# Patient Record
Sex: Female | Born: 1960 | Race: White | Hispanic: No | Marital: Married
Health system: Southern US, Community
[De-identification: ages and names within clinical notes are randomized; demographics above are authoritative.]

---
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
## 1997-11-04 HISTORY — PX: BREAST EXCISIONAL BIOPSY: SUR124

## 1998-05-15 ENCOUNTER — Ambulatory Visit (HOSPITAL_COMMUNITY): Admission: RE | Admit: 1998-05-15 | Discharge: 1998-05-15 | Payer: Self-pay | Admitting: *Deleted

## 1998-11-13 ENCOUNTER — Ambulatory Visit (HOSPITAL_COMMUNITY): Admission: RE | Admit: 1998-11-13 | Discharge: 1998-11-13 | Payer: Self-pay | Admitting: *Deleted

## 1998-11-13 ENCOUNTER — Encounter: Payer: Self-pay | Admitting: *Deleted

## 1998-11-21 ENCOUNTER — Other Ambulatory Visit: Admission: RE | Admit: 1998-11-21 | Discharge: 1998-11-21 | Payer: Self-pay | Admitting: *Deleted

## 1999-12-04 ENCOUNTER — Ambulatory Visit (HOSPITAL_COMMUNITY): Admission: RE | Admit: 1999-12-04 | Discharge: 1999-12-04 | Payer: Self-pay | Admitting: *Deleted

## 1999-12-04 ENCOUNTER — Encounter: Payer: Self-pay | Admitting: *Deleted

## 2000-12-05 ENCOUNTER — Encounter: Payer: Self-pay | Admitting: *Deleted

## 2000-12-05 ENCOUNTER — Ambulatory Visit (HOSPITAL_COMMUNITY): Admission: RE | Admit: 2000-12-05 | Discharge: 2000-12-05 | Payer: Self-pay | Admitting: *Deleted

## 2000-12-08 ENCOUNTER — Encounter: Admission: RE | Admit: 2000-12-08 | Discharge: 2000-12-08 | Payer: Self-pay | Admitting: *Deleted

## 2000-12-08 ENCOUNTER — Encounter: Payer: Self-pay | Admitting: *Deleted

## 2001-01-02 ENCOUNTER — Other Ambulatory Visit: Admission: RE | Admit: 2001-01-02 | Discharge: 2001-01-02 | Payer: Self-pay | Admitting: *Deleted

## 2002-01-26 ENCOUNTER — Ambulatory Visit (HOSPITAL_COMMUNITY): Admission: RE | Admit: 2002-01-26 | Discharge: 2002-01-26 | Payer: Self-pay | Admitting: *Deleted

## 2002-01-26 ENCOUNTER — Encounter: Payer: Self-pay | Admitting: *Deleted

## 2002-01-27 ENCOUNTER — Other Ambulatory Visit: Admission: RE | Admit: 2002-01-27 | Discharge: 2002-01-27 | Payer: Self-pay | Admitting: *Deleted

## 2003-03-17 ENCOUNTER — Encounter: Payer: Self-pay | Admitting: *Deleted

## 2003-03-17 ENCOUNTER — Ambulatory Visit (HOSPITAL_COMMUNITY): Admission: RE | Admit: 2003-03-17 | Discharge: 2003-03-17 | Payer: Self-pay | Admitting: *Deleted

## 2003-03-31 ENCOUNTER — Other Ambulatory Visit: Admission: RE | Admit: 2003-03-31 | Discharge: 2003-03-31 | Payer: Self-pay | Admitting: *Deleted

## 2004-03-20 ENCOUNTER — Ambulatory Visit (HOSPITAL_COMMUNITY): Admission: RE | Admit: 2004-03-20 | Discharge: 2004-03-20 | Payer: Self-pay | Admitting: *Deleted

## 2004-07-25 ENCOUNTER — Other Ambulatory Visit: Admission: RE | Admit: 2004-07-25 | Discharge: 2004-07-25 | Payer: Self-pay | Admitting: *Deleted

## 2004-10-01 ENCOUNTER — Encounter (INDEPENDENT_AMBULATORY_CARE_PROVIDER_SITE_OTHER): Payer: Self-pay | Admitting: Specialist

## 2004-10-01 ENCOUNTER — Ambulatory Visit (HOSPITAL_COMMUNITY): Admission: RE | Admit: 2004-10-01 | Discharge: 2004-10-01 | Payer: Self-pay | Admitting: Obstetrics and Gynecology

## 2004-10-01 HISTORY — PX: HYSTEROSCOPY WITH RESECTOSCOPE: SHX5395

## 2005-07-02 ENCOUNTER — Ambulatory Visit (HOSPITAL_COMMUNITY): Admission: RE | Admit: 2005-07-02 | Discharge: 2005-07-02 | Payer: Self-pay | Admitting: *Deleted

## 2005-08-20 ENCOUNTER — Other Ambulatory Visit: Admission: RE | Admit: 2005-08-20 | Discharge: 2005-08-20 | Payer: Self-pay | Admitting: *Deleted

## 2006-07-03 ENCOUNTER — Ambulatory Visit (HOSPITAL_COMMUNITY): Admission: RE | Admit: 2006-07-03 | Discharge: 2006-07-03 | Payer: Self-pay | Admitting: Obstetrics & Gynecology

## 2006-08-22 ENCOUNTER — Other Ambulatory Visit: Admission: RE | Admit: 2006-08-22 | Discharge: 2006-08-22 | Payer: Self-pay | Admitting: Obstetrics and Gynecology

## 2007-07-06 ENCOUNTER — Ambulatory Visit (HOSPITAL_COMMUNITY): Admission: RE | Admit: 2007-07-06 | Discharge: 2007-07-06 | Payer: Self-pay | Admitting: Obstetrics and Gynecology

## 2008-07-01 ENCOUNTER — Other Ambulatory Visit: Admission: RE | Admit: 2008-07-01 | Discharge: 2008-07-01 | Payer: Self-pay | Admitting: Obstetrics and Gynecology

## 2008-07-06 ENCOUNTER — Ambulatory Visit (HOSPITAL_COMMUNITY): Admission: RE | Admit: 2008-07-06 | Discharge: 2008-07-06 | Payer: Self-pay | Admitting: Obstetrics and Gynecology

## 2008-07-11 ENCOUNTER — Encounter: Payer: Self-pay | Admitting: Obstetrics and Gynecology

## 2008-07-11 ENCOUNTER — Ambulatory Visit (HOSPITAL_BASED_OUTPATIENT_CLINIC_OR_DEPARTMENT_OTHER): Admission: RE | Admit: 2008-07-11 | Discharge: 2008-07-11 | Payer: Self-pay | Admitting: Obstetrics and Gynecology

## 2008-07-11 HISTORY — PX: HYSTEROSCOPY WITH RESECTOSCOPE: SHX5395

## 2009-07-07 ENCOUNTER — Ambulatory Visit (HOSPITAL_COMMUNITY): Admission: RE | Admit: 2009-07-07 | Discharge: 2009-07-07 | Payer: Self-pay | Admitting: Obstetrics and Gynecology

## 2010-07-10 ENCOUNTER — Ambulatory Visit (HOSPITAL_COMMUNITY): Admission: RE | Admit: 2010-07-10 | Discharge: 2010-07-10 | Payer: Self-pay | Admitting: Obstetrics and Gynecology

## 2011-05-07 NOTE — Op Note (Signed)
NAME:  Robin Shields, Robin Shields           ACCOUNT NO.:  [id number]   MEDICAL RECORD NO.:  [id number]          PATIENT TYPE:  AMB   LOCATION:  [facility]                         FACILITY:  [facility]   PHYSICIAN:  Cynthia P. Romine, M.D.DATE OF BIRTH:  1961/11/09   DATE OF PROCEDURE:  07/11/2008  DATE OF DISCHARGE:                               OPERATIVE REPORT   PREOPERATIVE DIAGNOSIS:  Menorrhagia with endometrial polyp.   POSTOPERATIVE DIAGNOSIS:  Menorrhagia with endometrial polyp, pathology  pending.   PROCEDURE:  Examination under anesthesia.  Hysteroscopic resection of  endometrial polyp, dilation and curettage.   SURGEON:  Dr. Aram Beecham Romine.   ANESTHESIA:  General by LMA.   ESTIMATED BLOOD LOSS:  Minimal.   SORBITOL DEFICIT:  30 mL.   COMPLICATIONS:  None.   PROCEDURE:  The patient was taken to the operating room and after  induction of adequate general anesthesia, was placed in dorsal lithotomy  position and prepped and draped in the usual fashion.  The bladder was  drained with a red rubber catheter.  A posterior weighted and anterior  Sims retractor were placed.  The cervix was grasped on its anterior lip  with a single-tooth tenaculum.  Paracervical block was given by  injecting 10 mL of 1% Xylocaine at each of 3 and 9 o'clock.  The uterus  was sounded to 11 cm.  The cervix was dilated to a #31 Pratt.  The  operative hysteroscope was then inserted under direct visualization.  Sorbitol was used as the distention medium.  The pressure pump was set  at 70 mmHg.  Hysteroscopy revealed the uterine cavity did have multiple  polyps primarily in the body of the posterior side of the endometrium  and there was shaggy endometrium throughout.  The single loop cautery  was used to resect the polyps after photographic documentation was  taken.  Following hysteroscopic resection, the scope was withdrawn and  sharp curettage was carried out gently.  A very large amount of tissue  was obtained  with the curettage.  It was sent to pathology with the  polyp.  The hysteroscope was then reintroduced.  The cavity was felt to  be clean and the procedure was terminated.  The scope was withdrawn.  The instruments were removed from the vagina and the patient was taken  to recovery room in satisfactory condition.      Cynthia P. Romine, M.D.  Electronically Signed     CPR/MEDQ  D:  07/11/2008  T:  07/11/2008  Job:  [id number]

## 2011-05-10 NOTE — Op Note (Signed)
NAME:  Robin Shields, Robin Shields           ACCOUNT NO.:  [id number]   MEDICAL RECORD NO.:  [id number]          PATIENT TYPE:  AMB   LOCATION:  [facility]                           FACILITY:  [facility]   PHYSICIAN:  Cynthia P. Romine, M.D.DATE OF BIRTH:  1961/02/01   DATE OF PROCEDURE:  10/01/2004  DATE OF DISCHARGE:                                 OPERATIVE REPORT   PREOPERATIVE DIAGNOSES:  Menorrhagia, known endometrial polyps on  sonohysterogram.   POSTOPERATIVE DIAGNOSES:  Menorrhagia, known endometrial polyps on  sonohysterogram. Path pending.   PROCEDURE:  Hysteroscopic resection of endometrial polyps, D&C.   SURGEON:  Cynthia P. Romine, M.D.   ANESTHESIA:  General by mask.   ESTIMATED BLOOD LOSS:  Minimal.   SORBITOL DEFICIT:  70 mL.   COMPLICATIONS:  None.   DESCRIPTION OF PROCEDURE:  The patient was taken to the operating room and  after the induction of IV sedation by anesthesia, she was prepped and draped  in the usual fashion and bladder drained with a red rubber catheter.  A  posterior weighted and anterior Simms retractor were placed, the cervix was  grasped off the anterior lip with a single tooth tenaculum.  The uterus  sounded to 9 cm. During the process of sounding the cervix to a #31 Shawnie Pons,  the patient became uncomfortable and anesthesia elected to give her general  anesthesia by mask.  The operative hysteroscope was introduced, several  polyps were noted along the posterior wall of the uterus as known by  sonohysterogram.  Resection was carried out with a single loop. The  hysteroscope was withdrawn, sharp curettage was carried out with a large  amount of tissue being obtained. The hysteroscope was reintroduced, several  more areas of shaggy endometrium were resected.  __________ graphic  documentation was taken as a clear cavity after resection. Instruments  removed from the vagina and the procedure was terminated. The #10 tenaculum  was removed, there was some bleeding at  the tenaculum site that was  controlled with silver nitrate.      CPR/MEDQ  D:  10/01/2004  T:  10/01/2004  Job:  [id number]

## 2011-07-04 ENCOUNTER — Other Ambulatory Visit: Payer: Self-pay | Admitting: Obstetrics and Gynecology

## 2011-07-04 DIAGNOSIS — Z1231 Encounter for screening mammogram for malignant neoplasm of breast: Secondary | ICD-10-CM

## 2011-07-12 ENCOUNTER — Ambulatory Visit (HOSPITAL_COMMUNITY)
Admission: RE | Admit: 2011-07-12 | Discharge: 2011-07-12 | Disposition: A | Discharge status: Home / Self Care | Payer: 59 | Source: Ambulatory Visit | Attending: Obstetrics and Gynecology | Admitting: Obstetrics and Gynecology

## 2011-07-12 DIAGNOSIS — Z1231 Encounter for screening mammogram for malignant neoplasm of breast: Secondary | ICD-10-CM | POA: Insufficient documentation

## 2011-09-20 LAB — CBC
HCT: 42.3
Hemoglobin: 14
MCHC: 33.1
MCV: 92.8
Platelets: 306
RBC: 4.56
RDW: 13.4
WBC: 4.4

## 2011-09-20 LAB — HCG, SERUM, QUALITATIVE: Preg, Serum: NEGATIVE

## 2012-06-18 ENCOUNTER — Other Ambulatory Visit: Payer: Self-pay | Admitting: Obstetrics and Gynecology

## 2012-06-18 DIAGNOSIS — Z1231 Encounter for screening mammogram for malignant neoplasm of breast: Secondary | ICD-10-CM

## 2012-07-29 ENCOUNTER — Ambulatory Visit (HOSPITAL_COMMUNITY)
Admission: RE | Admit: 2012-07-29 | Discharge: 2012-07-29 | Disposition: A | Discharge status: Home / Self Care | Payer: BC Managed Care – PPO | Source: Ambulatory Visit | Attending: Obstetrics and Gynecology | Admitting: Obstetrics and Gynecology

## 2012-07-29 DIAGNOSIS — Z1231 Encounter for screening mammogram for malignant neoplasm of breast: Secondary | ICD-10-CM | POA: Insufficient documentation

## 2013-06-09 ENCOUNTER — Telehealth: Payer: Self-pay | Admitting: Obstetrics and Gynecology

## 2013-06-09 NOTE — Telephone Encounter (Signed)
Patient having menopausal symptoms she thinks and wants to speak with nurse.

## 2013-06-09 NOTE — Telephone Encounter (Signed)
LMTCB 06/09/13 cm

## 2013-06-09 NOTE — Telephone Encounter (Signed)
Patient called back to speak with someone about the symptoms she has been having due to menopause.Dizzy,nausea,hot flashes.

## 2013-06-15 NOTE — Telephone Encounter (Signed)
Spoke with patient she was complaining of hot flashes,  nausea, dizzy spells last week at work, The nausea and dizzy spells have subsided still having an  occasional hot flash much better than last week. Pt will call back to schedule an appointment with Dr. Tresa Res before August if she needs to come in to discuss Treatment.

## 2013-06-15 NOTE — Telephone Encounter (Signed)
LMTCB 06/15/13 cm

## 2013-07-30 ENCOUNTER — Other Ambulatory Visit: Payer: Self-pay | Admitting: Obstetrics and Gynecology

## 2013-07-30 DIAGNOSIS — Z1231 Encounter for screening mammogram for malignant neoplasm of breast: Secondary | ICD-10-CM

## 2013-08-02 ENCOUNTER — Ambulatory Visit (HOSPITAL_COMMUNITY)
Admission: RE | Admit: 2013-08-02 | Discharge: 2013-08-02 | Disposition: A | Discharge status: Home / Self Care | Payer: BC Managed Care – PPO | Source: Ambulatory Visit | Attending: Obstetrics and Gynecology | Admitting: Obstetrics and Gynecology

## 2013-08-02 DIAGNOSIS — Z1231 Encounter for screening mammogram for malignant neoplasm of breast: Secondary | ICD-10-CM | POA: Insufficient documentation

## 2013-10-21 ENCOUNTER — Encounter: Payer: Self-pay | Admitting: Obstetrics and Gynecology

## 2013-10-25 ENCOUNTER — Ambulatory Visit: Payer: Self-pay | Admitting: Obstetrics and Gynecology

## 2013-10-25 ENCOUNTER — Ambulatory Visit (INDEPENDENT_AMBULATORY_CARE_PROVIDER_SITE_OTHER): Payer: BC Managed Care – PPO | Admitting: Gynecology

## 2013-10-25 ENCOUNTER — Encounter: Payer: Self-pay | Admitting: Gynecology

## 2013-10-25 VITALS — BP 104/60 | HR 72 | Resp 14 | Ht 65.75 in | Wt 116.0 lb

## 2013-10-25 DIAGNOSIS — Z01419 Encounter for gynecological examination (general) (routine) without abnormal findings: Secondary | ICD-10-CM

## 2013-10-25 DIAGNOSIS — Z0000 Encounter for general adult medical examination without abnormal findings: Secondary | ICD-10-CM

## 2013-10-25 DIAGNOSIS — Z124 Encounter for screening for malignant neoplasm of cervix: Secondary | ICD-10-CM

## 2013-10-25 LAB — POCT URINALYSIS DIPSTICK
Leukocytes, UA: NEGATIVE
Urobilinogen, UA: NEGATIVE
pH, UA: 5

## 2013-10-25 LAB — HEMOGLOBIN, FINGERSTICK: Hemoglobin, fingerstick: 14.2 g/dL (ref 12.0–16.0)

## 2013-10-25 NOTE — Progress Notes (Signed)
52 y.o. Married Caucasian female   [phone] here for annual exam. Pt reports menses are absent. She does report hot flashes, does have night sweats, does have vaginal dryness.  She is using lubricants.  Pt reports last cycle 08/09/2013, prior was 4/14 and 3/14.  Last cycle lasted 2w, light usually heavy.  2 cycles in March.  June reports weight gain that went away after August cycle.    Patient's last menstrual period was 08/09/2013.          Sexually active: yes  The current method of family planning:  none.    Exercising: yes  swim, run 3x/wk Last pap: 10/04/11 Negative Abnormal PAP: not sure Mammogram: 08/03/13- Bi Rads 1 BSE: yes Colonoscopy: 12/14/09 DEXA: none Alcohol: no Tobacco: no  Hgb: 14.2  ; Urine: Leuks 2  Health Maintenance  Topic Date Due  . Pap Smear  06/11/1977  . Colonoscopy  06/11/2009  . Influenza Vaccine  07/23/2013  . Mammogram  08/03/2015  . Tetanus/tdap  09/19/2020    Family History  Problem Relation Age of Onset  . Breast cancer Sister 68    BRCA negative  . Hypertension Mother   . Breast cancer Maternal Grandmother     There are no active problems to display for this patient.   History reviewed. No pertinent past medical history.  Past Surgical History  Procedure Laterality Date  . Tonsillectomy and adenoidectomy  1965  . Hysteroscopy with resectoscope Left 10/01/2004    endo polyps- (CR)  . Hysteroscopy with resectoscope  07/11/2008    Polyp, D&C- (CR)    Allergies: Codeine and Morphine and related  Current Outpatient Prescriptions  Medication Sig Dispense Refill  . Ibuprofen (ADVIL PO) Take by mouth as needed.       Marland Kitchen MAGNESIUM PO Take by mouth.      . Multiple Vitamins-Minerals (MULTIVITAMIN PO) Take by mouth.      . Flaxseed, Linseed, (FLAX SEED OIL PO) Take by mouth.      Marland Kitchen FLUVIRIN PRESERVATIVE FREE SUSP injection        No current facility-administered medications for this visit.    ROS: Pertinent items are noted in  HPI.  Exam:    BP 104/60  Pulse 72  Resp 14  Ht 5' 5.75" (1.67 m)  Wt 116 lb (52.617 kg)  BMI 18.87 kg/m2  LMP 08/09/2013 Weight change: @WEIGHTCHANGE @ Last 3 height recordings:  Ht Readings from Last 3 Encounters:  10/25/13 5' 5.75" (1.67 m)   General appearance: alert, cooperative and appears stated age Head: Normocephalic, without obvious abnormality, atraumatic Neck: no adenopathy, no carotid bruit, no JVD, supple, symmetrical, trachea midline and thyroid not enlarged, symmetric, no tenderness/mass/nodules Lungs: clear to auscultation bilaterally Breasts: normal appearance, no masses or tenderness Heart: regular rate and rhythm, S1, S2 normal, no murmur, click, rub or gallop Abdomen: soft, non-tender; bowel sounds normal; no masses,  no organomegaly Extremities: extremities normal, atraumatic, no cyanosis or edema Skin: Skin color, texture, turgor normal. No rashes or lesions Lymph nodes: Cervical, supraclavicular, and axillary nodes normal. no inguinal nodes palpated Neurologic: Grossly normal   Pelvic: External genitalia:  no lesions              Urethra: normal appearing urethra with no masses, tenderness or lesions              Bartholins and Skenes: normal                 Vagina: normal appearing vagina  with normal color and discharge, no lesions              Cervix: normal appearance              Pap taken: yes        Bimanual Exam:  Uterus:  uterus is normal size, shape, consistency and nontender                                      Adnexa:    normal adnexa in size, nontender and no masses                                      Rectovaginal: Confirms                                      Anus:  normal sphincter tone, no lesions  A: well woman Perimenopausal      P: mammogram annual pap smear with HRHPV counseled on breast self exam, mammography screening, menopause, adequate intake of calcium and vitamin D, diet and exercise return annually or prn Discussed PAP  guideline changes, importance of weight bearing exercises, calcium, vit D and balanced diet.  An After Visit Summary was printed and given to the patient.

## 2013-10-25 NOTE — Patient Instructions (Signed)

## 2013-10-27 LAB — IPS PAP TEST WITH HPV

## 2014-06-16 ENCOUNTER — Encounter: Payer: Self-pay | Admitting: Gynecology

## 2014-07-25 ENCOUNTER — Other Ambulatory Visit: Payer: Self-pay | Admitting: Gynecology

## 2014-07-25 DIAGNOSIS — Z1231 Encounter for screening mammogram for malignant neoplasm of breast: Secondary | ICD-10-CM

## 2014-08-04 ENCOUNTER — Ambulatory Visit (HOSPITAL_COMMUNITY)
Admission: RE | Admit: 2014-08-04 | Discharge: 2014-08-04 | Disposition: A | Discharge status: Home / Self Care | Payer: BC Managed Care – PPO | Source: Ambulatory Visit | Attending: Gynecology | Admitting: Gynecology

## 2014-08-04 DIAGNOSIS — Z1231 Encounter for screening mammogram for malignant neoplasm of breast: Secondary | ICD-10-CM | POA: Insufficient documentation

## 2014-10-19 ENCOUNTER — Telehealth: Payer: Self-pay | Admitting: Gynecology

## 2014-10-19 NOTE — Telephone Encounter (Signed)
Left message regarding cancelled appointment. °

## 2014-10-24 ENCOUNTER — Encounter: Payer: Self-pay | Admitting: Gynecology

## 2014-10-31 ENCOUNTER — Ambulatory Visit: Payer: BC Managed Care – PPO | Admitting: Gynecology

## 2014-12-26 ENCOUNTER — Other Ambulatory Visit: Payer: Self-pay | Admitting: Obstetrics and Gynecology

## 2014-12-27 LAB — CYTOLOGY - PAP

## 2015-10-10 ENCOUNTER — Other Ambulatory Visit: Payer: Self-pay

## 2015-10-10 DIAGNOSIS — Z1231 Encounter for screening mammogram for malignant neoplasm of breast: Secondary | ICD-10-CM

## 2015-10-12 ENCOUNTER — Ambulatory Visit
Admission: RE | Admit: 2015-10-12 | Discharge: 2015-10-12 | Disposition: A | Discharge status: Home / Self Care | Payer: BLUE CROSS/BLUE SHIELD | Source: Ambulatory Visit

## 2015-10-12 DIAGNOSIS — Z1231 Encounter for screening mammogram for malignant neoplasm of breast: Secondary | ICD-10-CM

## 2016-12-07 ENCOUNTER — Emergency Department (HOSPITAL_COMMUNITY): Payer: BLUE CROSS/BLUE SHIELD

## 2016-12-07 ENCOUNTER — Encounter (HOSPITAL_COMMUNITY): Payer: Self-pay | Admitting: Emergency Medicine

## 2016-12-07 ENCOUNTER — Emergency Department (HOSPITAL_COMMUNITY)
Admission: EM | Admit: 2016-12-07 | Discharge: 2016-12-07 | Disposition: A | Discharge status: Home / Self Care | Payer: BLUE CROSS/BLUE SHIELD | Attending: Emergency Medicine | Admitting: Emergency Medicine

## 2016-12-07 DIAGNOSIS — Y939 Activity, unspecified: Secondary | ICD-10-CM | POA: Diagnosis not present

## 2016-12-07 DIAGNOSIS — S0003XA Contusion of scalp, initial encounter: Secondary | ICD-10-CM | POA: Insufficient documentation

## 2016-12-07 DIAGNOSIS — Y929 Unspecified place or not applicable: Secondary | ICD-10-CM | POA: Insufficient documentation

## 2016-12-07 DIAGNOSIS — S00431A Contusion of right ear, initial encounter: Secondary | ICD-10-CM | POA: Insufficient documentation

## 2016-12-07 DIAGNOSIS — Y999 Unspecified external cause status: Secondary | ICD-10-CM | POA: Insufficient documentation

## 2016-12-07 DIAGNOSIS — S0083XA Contusion of other part of head, initial encounter: Secondary | ICD-10-CM

## 2016-12-07 DIAGNOSIS — S0990XA Unspecified injury of head, initial encounter: Secondary | ICD-10-CM | POA: Diagnosis present

## 2016-12-07 DIAGNOSIS — W108XXA Fall (on) (from) other stairs and steps, initial encounter: Secondary | ICD-10-CM | POA: Diagnosis not present

## 2016-12-07 MED ORDER — IBUPROFEN 800 MG PO TABS
800.0000 mg | ORAL_TABLET | Freq: Once | ORAL | Status: AC
  Administered 2016-12-07: 800 mg via ORAL
  Filled 2016-12-07: qty 1

## 2016-12-07 NOTE — ED Notes (Signed)
Pt pulling at c-collar, instructed pt the importance of not to mess with c-collar inside c-spine is cleared.

## 2016-12-07 NOTE — ED Notes (Signed)
Pressure dressing applied to hematoma on R ear

## 2016-12-07 NOTE — ED Notes (Signed)
EDP at bedside  

## 2016-12-07 NOTE — ED Provider Notes (Addendum)
[facility] DEPT Provider Note   CSN: [id number] Arrival date & time: 12/07/16  0111   By signing my name below, I, Evelene Croon, attest that this documentation has been prepared under the direction and in the presence of Orpah Greek, MD . Electronically Signed: Evelene Croon, Scribe. 12/07/2016. 4:39 AM.   History   Chief Complaint Chief Complaint  Patient presents with  . Fall    The history is provided by the patient. No language interpreter was used.   HPI Comments:  Robin Shields is a 55 y.o. female who presents to the Emergency Department via EMS s/p fall just PTA. Pt arrives in c-collar. Pt fell down 8-10 steps, states she changed her mind at the top and wanted to come back down and lost her balance. She states she struck her face on the steps and the back of her head on the hardwood ground; no LOC. At this time is complaining of a HA and a knot to back of head following the incident. She reports associated neck soreness, nasal pain, and right lower back pain. She notes her right ear feels like it has water in it; reports decreased hearing from the ear. Pt's family reports blood coming from the ear. No alleviating factors noted.   History reviewed. No pertinent past medical history.  There are no active problems to display for this patient.   Past Surgical History:  Procedure Laterality Date  . HYSTEROSCOPY WITH RESECTOSCOPE Left 10/01/2004   endo polyps- (CR)  . HYSTEROSCOPY WITH RESECTOSCOPE  07/11/2008   Polyp, D&C- (CR)  . TONSILLECTOMY AND ADENOIDECTOMY  1965    OB History    Gravida Para Term Preterm AB Living   _0 SAB TAB Ectopic Multiple Live Births           3       Home Medications    Prior to Admission medications   Medication Sig Start Date End Date Taking? Authorizing Provider  Cholecalciferol (VITAMIN D PO) Take 1 tablet by mouth daily.   Yes Historical Provider, MD  Flaxseed, Linseed, (FLAX SEED OIL PO) Take 1  tablet by mouth daily.    Yes Historical Provider, MD  KRILL OIL PO Take 1 tablet by mouth daily.   Yes Historical Provider, MD  MAGNESIUM PO Take 1 tablet by mouth daily.    Yes Historical Provider, MD  Multiple Vitamin (MULTIVITAMIN WITH MINERALS) TABS tablet Take 1 tablet by mouth daily.   Yes Historical Provider, MD  Probiotic Product (PROBIOTIC PO) Take 1 tablet by mouth daily.   Yes Historical Provider, MD    Family History Family History  Problem Relation Age of Onset  . Breast cancer Sister 36    BRCA negative  . Hypertension Mother   . Breast cancer Maternal Grandmother     Social History Social History  Substance Use Topics  . Smoking status: Not on file  . Smokeless tobacco: Not on file  . Alcohol use Not on file     Allergies   Codeine and Morphine and related   Review of Systems Review of Systems  HENT: Positive for ear discharge and ear pain.   Respiratory: Negative for shortness of breath.   Musculoskeletal: Positive for back pain and neck pain.  Neurological: Positive for headaches. Negative for syncope.  All other systems reviewed and are negative.    Physical Exam Updated Vital Signs BP 105/73   Pulse (!) 56  Temp 97.7 F (36.5 C) (Oral)   Resp 13   Ht _0  (1.575 m)   Wt 122 lb (55.3 kg)   SpO2 96%   BMI 22.31 kg/m   Physical Exam  Constitutional: She is oriented to person, place, and time. She appears well-developed and well-nourished. No distress.  HENT:  Head: Normocephalic and atraumatic.  Right Ear: Hearing normal.  Left Ear: Hearing normal.  Nose: Nose normal.  Mouth/Throat: Oropharynx is clear and moist and mucous membranes are normal.  Dried blood external right ear and blood in the canal obscuring TM  Slight swelling of the auricle without hematoma formation  Linear abrasion lateral aspect of auricle, no active bleeding  Eyes: Conjunctivae and EOM are normal. Pupils are equal, round, and reactive to light.  Neck: Neck  supple.  Paraspinal neck tenderness  Cardiovascular: Regular rhythm, S1 normal and S2 normal.  Exam reveals no gallop and no friction rub.   No murmur heard. Pulmonary/Chest: Effort normal and breath sounds normal. No respiratory distress. She exhibits no tenderness.  Abdominal: Soft. Normal appearance and bowel sounds are normal. There is no hepatosplenomegaly. There is no tenderness. There is no rebound, no guarding, no tenderness at McBurney's point and negative Murphy's sign. No hernia.  Musculoskeletal: Normal range of motion. She exhibits tenderness.  Neurological: She is alert and oriented to person, place, and time. She has normal strength. No cranial nerve deficit or sensory deficit. Coordination normal. GCS eye subscore is 4. GCS verbal subscore is 5. GCS motor subscore is 6.  Skin: Skin is warm, dry and intact. No rash noted. No cyanosis.  Right parietal contusion  Psychiatric: She has a normal mood and affect. Her speech is normal and behavior is normal. Thought content normal.  Nursing note and vitals reviewed.    ED Treatments / Results  DIAGNOSTIC STUDIES:  Oxygen Saturation is 96% on RA, normal by my interpretation.    COORDINATION OF CARE:  1:36 AM Discussed treatment plan with pt at bedside and pt agreed to plan.  Labs (all labs ordered are listed, but only abnormal results are displayed) Labs Reviewed - No data to display  EKG  EKG Interpretation None       Radiology Ct Head Wo Contrast  Result Date: 12/07/2016 CLINICAL DATA:  55 year old female with fall and bleeding from the right ear. EXAM: CT HEAD WITHOUT CONTRAST CT MAXILLOFACIAL WITHOUT CONTRAST CT CERVICAL SPINE WITHOUT CONTRAST TECHNIQUE: Multidetector CT imaging of the head, cervical spine, and maxillofacial structures were performed using the standard protocol without intravenous contrast. Multiplanar CT image reconstructions of the cervical spine and maxillofacial structures were also generated.  COMPARISON:  None. FINDINGS: CT HEAD FINDINGS Brain: No evidence of acute infarction, hemorrhage, hydrocephalus, extra-axial collection or mass lesion/mass effect. Vascular: No hyperdense vessel or unexpected calcification. Skull: Normal. Negative for fracture or focal lesion. Other: Right parietal scalp hematoma. CT MAXILLOFACIAL FINDINGS Osseous: No fracture or mandibular dislocation. No destructive process. Orbits: Negative. No traumatic or inflammatory finding. Sinuses: Right maxillary sinus retention cyst or polyp. The visualized paranasal sinuses and mastoid air cells are otherwise clear. Soft tissues: Negative. CT CERVICAL SPINE FINDINGS Alignment: Normal. Skull base and vertebrae: No acute fracture. No primary bone lesion or focal pathologic process. Soft tissues and spinal canal: No prevertebral fluid or swelling. No visible canal hematoma. Disc levels:  No acute findings. Upper chest: Negative. Other: None IMPRESSION: No acute intracranial pathology. No acute/ traumatic cervical spine pathology. No facial bone fractures. Electronically Signed  By: Anner Crete M.D.   On: 12/07/2016 03:19   Ct Cervical Spine Wo Contrast  Result Date: 12/07/2016 CLINICAL DATA:  54 year old female with fall and bleeding from the right ear. EXAM: CT HEAD WITHOUT CONTRAST CT MAXILLOFACIAL WITHOUT CONTRAST CT CERVICAL SPINE WITHOUT CONTRAST TECHNIQUE: Multidetector CT imaging of the head, cervical spine, and maxillofacial structures were performed using the standard protocol without intravenous contrast. Multiplanar CT image reconstructions of the cervical spine and maxillofacial structures were also generated. COMPARISON:  None. FINDINGS: CT HEAD FINDINGS Brain: No evidence of acute infarction, hemorrhage, hydrocephalus, extra-axial collection or mass lesion/mass effect. Vascular: No hyperdense vessel or unexpected calcification. Skull: Normal. Negative for fracture or focal lesion. Other: Right parietal scalp  hematoma. CT MAXILLOFACIAL FINDINGS Osseous: No fracture or mandibular dislocation. No destructive process. Orbits: Negative. No traumatic or inflammatory finding. Sinuses: Right maxillary sinus retention cyst or polyp. The visualized paranasal sinuses and mastoid air cells are otherwise clear. Soft tissues: Negative. CT CERVICAL SPINE FINDINGS Alignment: Normal. Skull base and vertebrae: No acute fracture. No primary bone lesion or focal pathologic process. Soft tissues and spinal canal: No prevertebral fluid or swelling. No visible canal hematoma. Disc levels:  No acute findings. Upper chest: Negative. Other: None IMPRESSION: No acute intracranial pathology. No acute/ traumatic cervical spine pathology. No facial bone fractures. Electronically Signed   By: Anner Crete M.D.   On: 12/07/2016 03:19   Ct Maxillofacial Wo Contrast  Result Date: 12/07/2016 CLINICAL DATA:  55 year old female with fall and bleeding from the right ear. EXAM: CT HEAD WITHOUT CONTRAST CT MAXILLOFACIAL WITHOUT CONTRAST CT CERVICAL SPINE WITHOUT CONTRAST TECHNIQUE: Multidetector CT imaging of the head, cervical spine, and maxillofacial structures were performed using the standard protocol without intravenous contrast. Multiplanar CT image reconstructions of the cervical spine and maxillofacial structures were also generated. COMPARISON:  None. FINDINGS: CT HEAD FINDINGS Brain: No evidence of acute infarction, hemorrhage, hydrocephalus, extra-axial collection or mass lesion/mass effect. Vascular: No hyperdense vessel or unexpected calcification. Skull: Normal. Negative for fracture or focal lesion. Other: Right parietal scalp hematoma. CT MAXILLOFACIAL FINDINGS Osseous: No fracture or mandibular dislocation. No destructive process. Orbits: Negative. No traumatic or inflammatory finding. Sinuses: Right maxillary sinus retention cyst or polyp. The visualized paranasal sinuses and mastoid air cells are otherwise clear. Soft tissues:  Negative. CT CERVICAL SPINE FINDINGS Alignment: Normal. Skull base and vertebrae: No acute fracture. No primary bone lesion or focal pathologic process. Soft tissues and spinal canal: No prevertebral fluid or swelling. No visible canal hematoma. Disc levels:  No acute findings. Upper chest: Negative. Other: None IMPRESSION: No acute intracranial pathology. No acute/ traumatic cervical spine pathology. No facial bone fractures. Electronically Signed   By: Anner Crete M.D.   On: 12/07/2016 03:19    Procedures Procedures (including critical care time)  Medications Ordered in ED Medications  ibuprofen (ADVIL,MOTRIN) tablet 800 mg (800 mg Oral Given 12/07/16 0148)     Initial Impression / Assessment and Plan / ED Course  I have reviewed the triage vital signs and the nursing notes.  Pertinent labs & imaging results that were available during my care of the patient were reviewed by me and considered in my medical decision making (see chart for details).  Clinical Course    She presents to the emergency department for evaluation after a fall. Patient complaining of right scalp, right ear and neck pain since the fall. There was no loss of consciousness. Patient is alert and oriented. CT head, cervical spine, facial bones  performed. No fractures or other acute abnormality noted. Patient does have blood on the auricle of her right ear as well as in the canal. I was unable to visualize tympanic membrane. Cannot rule out rupture. No sign of basilar skull fracture (no clear fluid, bruising, normal CT). Will refer to ENT for follow-up and was given ruptured tympanic membrane precautions. Patient does have swelling of the right ear without any hematoma formation that can be drained. Pressure dressing applied.  Final Clinical Impressions(s) / ED Diagnoses   Final diagnoses:  Contusion of face, initial encounter  Contusion of auricle of right ear, initial encounter    New Prescriptions New  Prescriptions   No medications on file   I personally performed the services described in this documentation, which was scribed in my presence. The recorded information has been reviewed and is accurate.     Orpah Greek, MD 12/07/16 [place], MD 12/07/16 [phone]

## 2016-12-07 NOTE — ED Triage Notes (Signed)
Pt in EMS after fall down stairs, tripped and fell. Fell approx 8-10 steps. Reports neck pain, HA, buttocks pain. Has blood coming from R ear. Pt A/OX4, but slow to respond. VSS.

## 2017-02-27 ENCOUNTER — Other Ambulatory Visit: Payer: Self-pay | Admitting: Obstetrics and Gynecology

## 2017-02-27 DIAGNOSIS — Z1231 Encounter for screening mammogram for malignant neoplasm of breast: Secondary | ICD-10-CM

## 2017-03-18 ENCOUNTER — Ambulatory Visit
Admission: RE | Admit: 2017-03-18 | Discharge: 2017-03-18 | Disposition: A | Discharge status: Home / Self Care | Payer: BLUE CROSS/BLUE SHIELD | Source: Ambulatory Visit | Attending: Obstetrics and Gynecology | Admitting: Obstetrics and Gynecology

## 2017-03-18 ENCOUNTER — Encounter: Payer: Self-pay | Admitting: Radiology

## 2017-03-18 DIAGNOSIS — Z1231 Encounter for screening mammogram for malignant neoplasm of breast: Secondary | ICD-10-CM

## 2017-08-20 IMAGING — MG DIGITAL SCREENING BILATERAL MAMMOGRAM WITH CAD
5 series · 5 of 5 positions shown · non-contrast
Comparison: Previous exam(s).

CLINICAL DATA: Screening.

EXAM:
DIGITAL SCREENING BILATERAL MAMMOGRAM WITH CAD

[R MLO]
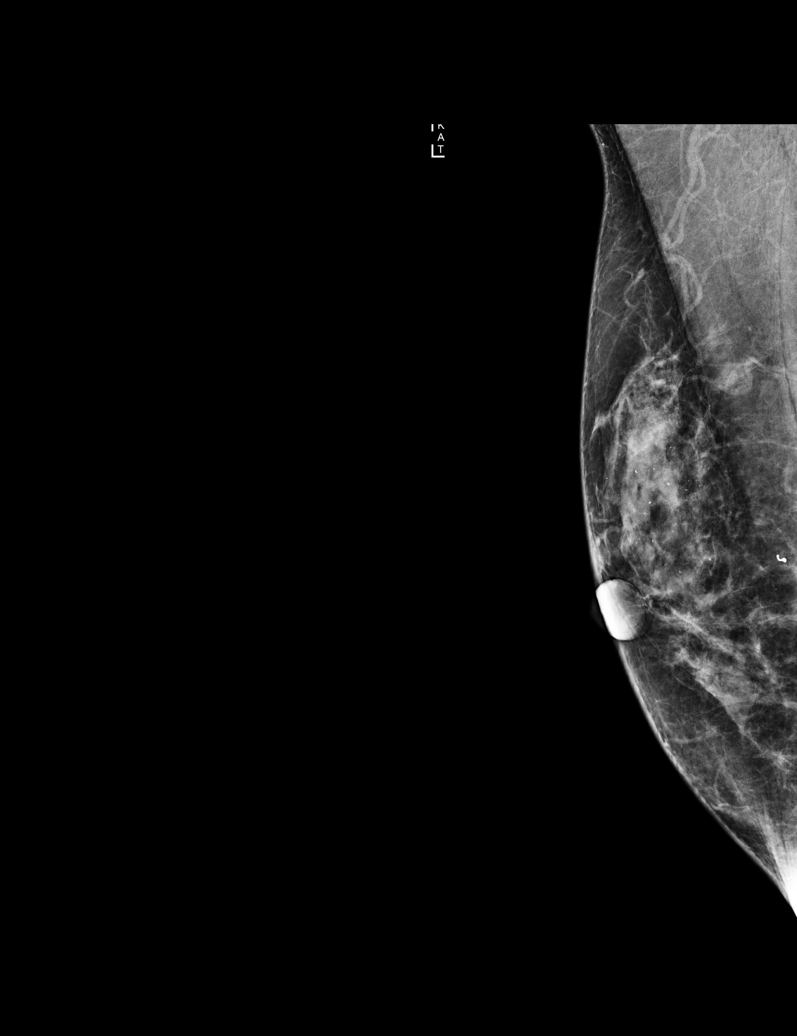

[L MLO (1 of 2)]
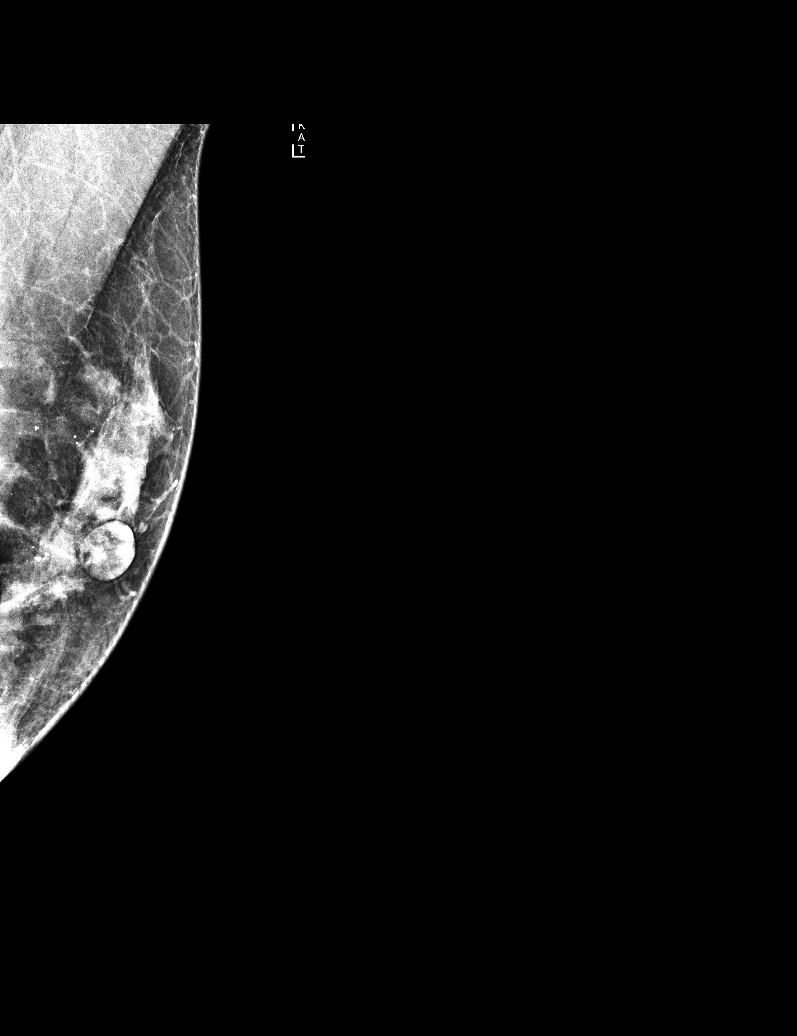

[R CC]
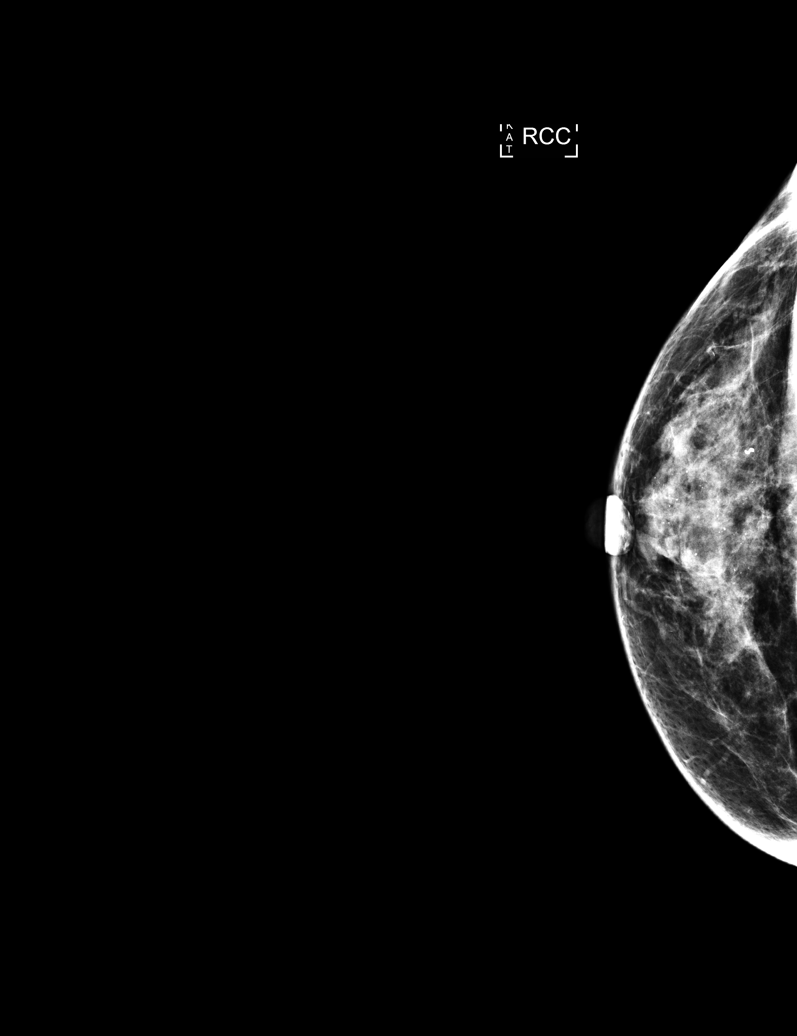

[L MLO (2 of 2)]
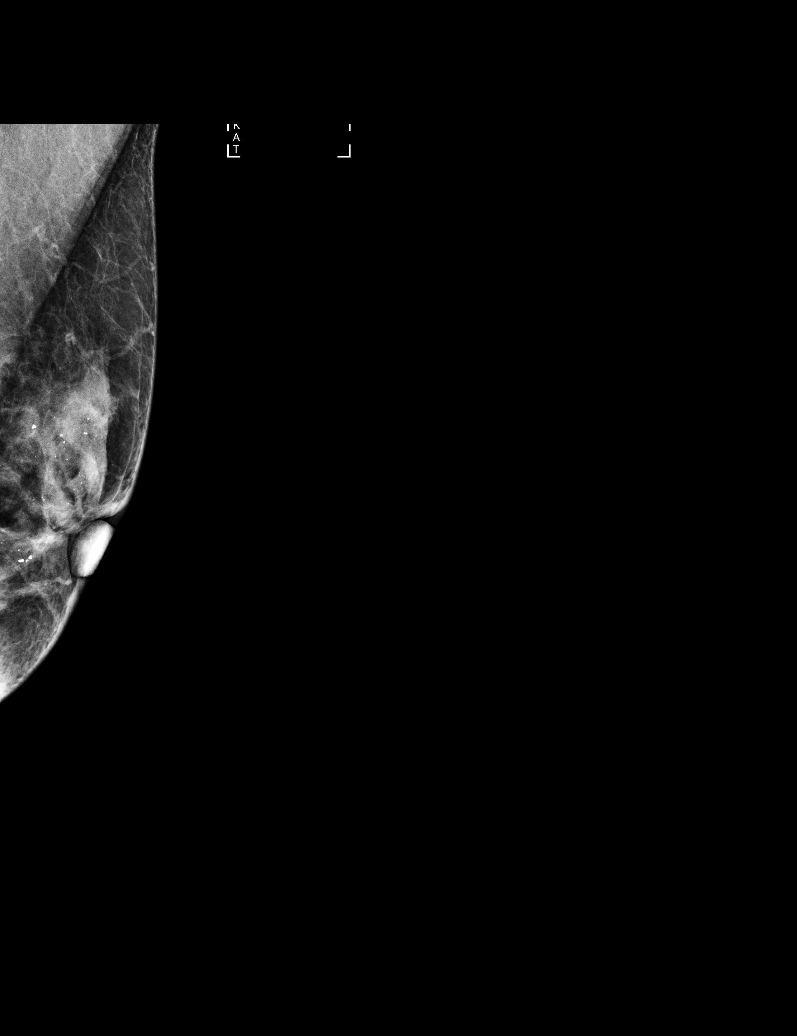

[L CC]
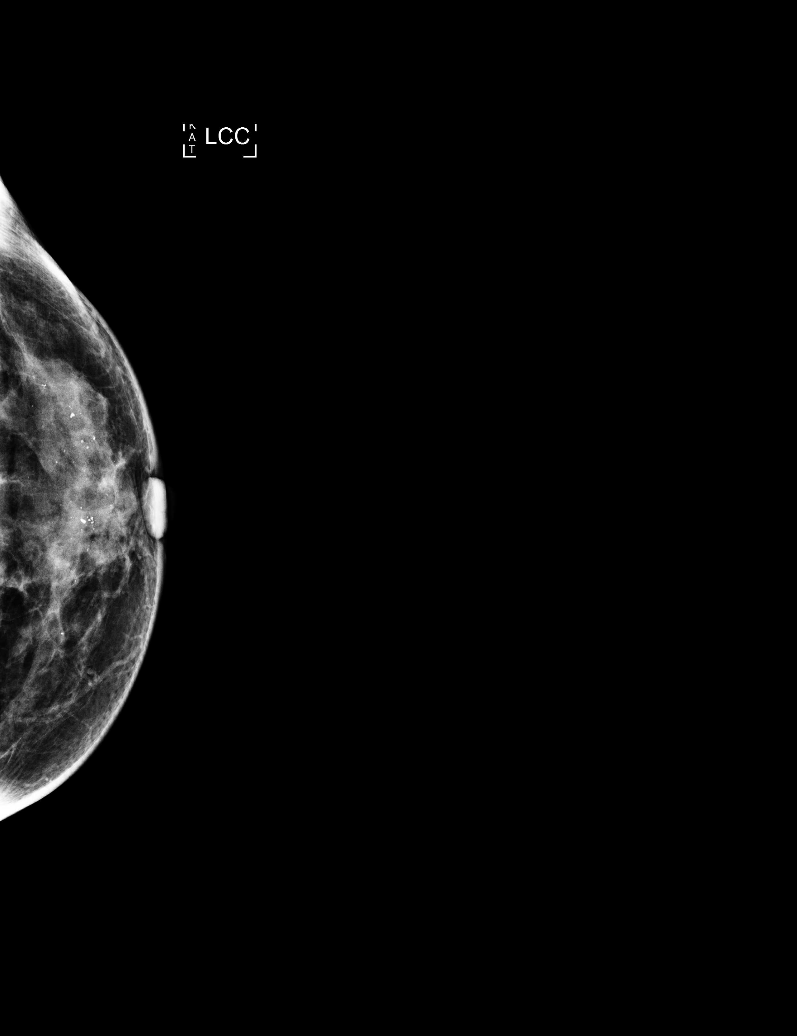

[5 of 5 positions shown; findings below may reference images not displayed]

ACR Breast Density Category c: The breast tissue is heterogeneously
dense, which may obscure small masses.
FINDINGS: There are no findings suspicious for malignancy. Images were
processed with CAD.
IMPRESSION: No mammographic evidence of malignancy. A result letter of this
screening mammogram will be mailed directly to the patient.

RECOMMENDATION:
Screening mammogram in one year. (Code:YJ-2-FEZ)

BI-RADS CATEGORY  1: Negative.

## 2018-09-28 ENCOUNTER — Other Ambulatory Visit: Payer: Self-pay | Admitting: Obstetrics & Gynecology

## 2018-09-28 DIAGNOSIS — Z1231 Encounter for screening mammogram for malignant neoplasm of breast: Secondary | ICD-10-CM

## 2018-10-30 ENCOUNTER — Ambulatory Visit: Payer: BLUE CROSS/BLUE SHIELD

## 2024-01-05 ENCOUNTER — Other Ambulatory Visit (INDEPENDENT_AMBULATORY_CARE_PROVIDER_SITE_OTHER): Payer: Self-pay

## 2024-01-05 ENCOUNTER — Encounter: Payer: Self-pay | Admitting: Orthopedic Surgery

## 2024-01-05 ENCOUNTER — Ambulatory Visit: Payer: 59 | Admitting: Orthopedic Surgery

## 2024-01-05 DIAGNOSIS — M25561 Pain in right knee: Secondary | ICD-10-CM | POA: Diagnosis not present

## 2024-01-06 ENCOUNTER — Encounter: Payer: Self-pay | Admitting: Orthopedic Surgery

## 2024-01-06 NOTE — Progress Notes (Signed)
 Office Visit Note   Patient: Robin Shields           Date of Birth: 27-Jun-1961           MRN: [id number] Visit Date: 01/05/2024 Requested by: No referring provider defined for this encounter. PCP: Patient, No Pcp Per  Subjective: Chief Complaint  Patient presents with   Right Knee - Pain    HPI: Robin Shields is a 63 y.o. female who presents to the office reporting right knee pain.  Patient is very active and describes sitting on the floor in December and injuring her right knee acutely when she was getting up from a crouched position.  Reported pain on both the lateral medial aspect of the right knee with swelling at that time.  She is gradually been able to functionally improve with the knee in its current condition but she still has pain as well as limitation of full extension and pain with flexion past 90 degrees.  This does limit her standing and walking endurance.  She likes to line dance for exercise as well as exercise regularly doing squats and swimming.  For her employment she puts dishes and boxes.  She used to work in [employer] which did involve a lot of crouching.  She is not taking any medication except for Advil  on 1 occasion which did not help much..                ROS: All systems reviewed are negative as they relate to the chief complaint within the history of present illness.  Patient denies fevers or chills.  Assessment & Plan: Visit Diagnoses:  1. Right knee pain, unspecified chronicity     Plan: Impression is right knee pain with mechanism of injury very consistent with meniscal tear which may be a locked type of bucket-handle tear due to her lack of full extension.  She does have trace effusion and painful flexion past 90 degrees and lacks about 10 degrees of full extension.  Radiographs show no arthritis or loose bodies in either knee.  With her locked knee MRI scan is indicated in order to evaluate the status of the meniscus on the medial  and lateral side and to plan accordingly in order to return her to a more functional status.  We will see her back after that study  Follow-Up Instructions: No follow-ups on file.   Orders:  Orders Placed This Encounter  Procedures   XR KNEE 3 VIEW RIGHT   MR Knee Right w/o contrast   No orders of the defined types were placed in this encounter.     Procedures: No procedures performed   Clinical Data: No additional findings.  Objective: Vital Signs: LMP 08/09/2013   Physical Exam:  Constitutional: Patient appears well-developed HEENT:  Head: Normocephalic Eyes:EOM are normal Neck: Normal range of motion Cardiovascular: Normal rate Pulmonary/chest: Effort normal Neurologic: Patient is alert Skin: Skin is warm Psychiatric: Patient has normal mood and affect  Ortho Exam: Ortho exam demonstrates slightly antalgic gait to the right due to inability to fully extend the right knee.  Trace effusion is present.  Medial and lateral joint line tenderness is present with stable collateral and cruciate ligaments.  Pedal pulses palpable.  No calf tenderness negative Homans.  No groin pain with internal/external rotation of the leg.  No other masses lymphadenopathy or skin changes noted in the right knee region.  She does have painful flexion past 90 to 100 degrees of flexion.  Also feels like she may have a Baker's cyst which is small in the back of the knee.  Specialty Comments:  No specialty comments available.  Imaging: XR KNEE 3 VIEW RIGHT Result Date: 01/05/2024 AP lateral merchant radiographs right knee reviewed.  Alignment intact.  No acute fracture.  No arthritis.  No loose bodies.  Normal radiographs right knee    PMFS History: There are no active problems to display for this patient.  History reviewed. No pertinent past medical history.  Family History  Problem Relation Age of Onset   Breast cancer Sister 31       BRCA negative   Hypertension Mother    Breast  cancer Maternal Grandmother     Past Surgical History:  Procedure Laterality Date   BREAST EXCISIONAL BIOPSY Left 11/04/1997   HYSTEROSCOPY WITH RESECTOSCOPE Left 10/01/2004   endo polyps- (CR)   HYSTEROSCOPY WITH RESECTOSCOPE  07/11/2008   Polyp, D&C- (CR)   TONSILLECTOMY AND ADENOIDECTOMY  1965   Social History   Occupational History   Not on file  Tobacco Use   Smoking status: Unknown   Smokeless tobacco: Not on file  Substance and Sexual Activity   Alcohol use: Not on file   Drug use: Not on file   Sexual activity: Not on file

## 2024-01-13 ENCOUNTER — Encounter: Payer: Self-pay | Admitting: Orthopedic Surgery

## 2024-01-21 ENCOUNTER — Ambulatory Visit
Admission: RE | Admit: 2024-01-21 | Discharge: 2024-01-21 | Disposition: A | Discharge status: Home / Self Care | Payer: 59 | Source: Ambulatory Visit | Attending: Orthopedic Surgery | Admitting: Orthopedic Surgery

## 2024-01-21 DIAGNOSIS — M25561 Pain in right knee: Secondary | ICD-10-CM | POA: Diagnosis not present

## 2024-01-21 DIAGNOSIS — M23351 Other meniscus derangements, posterior horn of lateral meniscus, right knee: Secondary | ICD-10-CM | POA: Diagnosis not present

## 2024-01-21 DIAGNOSIS — M23331 Other meniscus derangements, other medial meniscus, right knee: Secondary | ICD-10-CM | POA: Diagnosis not present

## 2024-01-27 NOTE — Progress Notes (Signed)
Does she have f/u?

## 2024-01-29 ENCOUNTER — Telehealth: Payer: Self-pay

## 2024-01-29 NOTE — Telephone Encounter (Signed)
Needs appt to review MRI

## 2024-01-29 NOTE — Telephone Encounter (Signed)
-----   Message from Marykay Snipes sent at 01/27/2024  3:09 PM EST ----- Does she have f/u?

## 2024-02-11 ENCOUNTER — Ambulatory Visit (INDEPENDENT_AMBULATORY_CARE_PROVIDER_SITE_OTHER): Payer: 59 | Admitting: Orthopedic Surgery

## 2024-02-11 DIAGNOSIS — M25461 Effusion, right knee: Secondary | ICD-10-CM

## 2024-02-11 NOTE — Progress Notes (Unsigned)
Office Visit Note   Patient: Robin Shields           Date of Birth: August 17, 1961           MRN: [id number] Visit Date: 02/11/2024 Requested by: No referring provider defined for this encounter. PCP: Patient, No Pcp Per  Subjective: Chief Complaint  Patient presents with   Right Knee - Pain    HPI: Robin Shields is a 63 y.o. female who presents to the office reporting right knee pain.  Since she was last seen she has had an MRI scan.  The MRI scan is reviewed.  Does show degenerative posterior lateral meniscal tears as well as horizontal cleavage tear on that medial meniscus.  Joint spaces well-maintained.  She had fairly significant effusion in the right knee at the time of the MRI scan which was about 2 and half weeks ago.  Patient states overall her knee is getting better.  She was able to walk a quarter of a mile in December.  She can walk a whole mile at this time.  She did stop doing loadbearing lifting exercises.  She states that the pain is not that bad but it is hard for her to stand a long time.  Pain does not wake her from sleep at night.  She is able to swim.  Denies any mechanical symptoms.              ROS: All systems reviewed are negative as they relate to the chief complaint within the history of present illness.  Patient denies fevers or chills.  Assessment & Plan: Visit Diagnoses:  1. Effusion, right knee     Plan: Impression is meniscal pathology becoming less symptomatic in Bentlee's knee.  Effusion has decreased significantly from the time of the MRI scan.  Overall she is improved but not yet normal.  She is not having any mechanical symptoms and her meniscal tears appear to be stable tears which are primarily degenerative.  Arthroscopy could still be considered for debridement if her symptoms worsen or become more mechanical in nature.  Injection performed today and we will see her back for another check in 6 weeks with decision for or against  arthroscopic intervention at that time based on her response to the injection as well as absence or presence of mechanical symptoms.  Follow-Up Instructions: No follow-ups on file.   Orders:  No orders of the defined types were placed in this encounter.  No orders of the defined types were placed in this encounter.     Procedures: Large Joint Inj on 02/11/2024 12:40 PM Indications: diagnostic evaluation, joint swelling and pain Details: 18 G 1.5 in needle, superolateral approach  Arthrogram: No  Medications: 5 mL lidocaine 1 %; 40 mg methylPREDNISolone acetate 40 MG/ML; 4 mL bupivacaine 0.25 % Outcome: tolerated well, no immediate complications Procedure, treatment alternatives, risks and benefits explained, specific risks discussed. Consent was given by the patient. Immediately prior to procedure a time out was called to verify the correct patient, procedure, equipment, support staff and site/side marked as required. Patient was prepped and draped in the usual sterile fashion.       Clinical Data: No additional findings.  Objective: Vital Signs: LMP 08/09/2013   Physical Exam:  Constitutional: Patient appears well-developed HEENT:  Head: Normocephalic Eyes:EOM are normal Neck: Normal range of motion Cardiovascular: Normal rate Pulmonary/chest: Effort normal Neurologic: Patient is alert Skin: Skin is warm Psychiatric: Patient has normal mood and affect  Ortho  Exam: Ortho exam demonstrates trace effusion in the right knee.  Collateral crucial ligaments are stable.  Has slightly more lateral joint line tenderness and medial.  Flexion is to about 105.  Extension is about 3 degrees from straight.  Gait has improved.  Specialty Comments:  No specialty comments available.  Imaging: No results found.   PMFS History: There are no active problems to display for this patient.  No past medical history on file.  Family History  Problem Relation Age of Onset   Breast  cancer Sister 60       BRCA negative   Hypertension Mother    Breast cancer Maternal Grandmother     Past Surgical History:  Procedure Laterality Date   BREAST EXCISIONAL BIOPSY Left 11/04/1997   HYSTEROSCOPY WITH RESECTOSCOPE Left 10/01/2004   endo polyps- (CR)   HYSTEROSCOPY WITH RESECTOSCOPE  07/11/2008   Polyp, D&C- (CR)   TONSILLECTOMY AND ADENOIDECTOMY  1965   Social History   Occupational History   Not on file  Tobacco Use   Smoking status: Unknown   Smokeless tobacco: Not on file  Substance and Sexual Activity   Alcohol use: Not on file   Drug use: Not on file   Sexual activity: Not on file

## 2024-02-12 ENCOUNTER — Encounter: Payer: Self-pay | Admitting: Orthopedic Surgery

## 2024-02-12 MED ORDER — BUPIVACAINE HCL 0.25 % IJ SOLN
4.0000 mL | INTRAMUSCULAR | Status: AC | PRN
  Administered 2024-02-11: 4 mL via INTRA_ARTICULAR

## 2024-02-12 MED ORDER — METHYLPREDNISOLONE ACETATE 40 MG/ML IJ SUSP
40.0000 mg | INTRAMUSCULAR | Status: AC | PRN
  Administered 2024-02-11: 40 mg via INTRA_ARTICULAR

## 2024-02-12 MED ORDER — LIDOCAINE HCL 1 % IJ SOLN
5.0000 mL | INTRAMUSCULAR | Status: AC | PRN
  Administered 2024-02-11: 5 mL

## 2024-02-18 ENCOUNTER — Encounter: Payer: Self-pay | Admitting: Orthopedic Surgery

## 2024-03-24 ENCOUNTER — Encounter: Payer: Self-pay | Admitting: Orthopedic Surgery

## 2024-03-24 ENCOUNTER — Telehealth: Payer: Self-pay

## 2024-03-24 ENCOUNTER — Ambulatory Visit (INDEPENDENT_AMBULATORY_CARE_PROVIDER_SITE_OTHER): Payer: 59 | Admitting: Orthopedic Surgery

## 2024-03-24 DIAGNOSIS — M25461 Effusion, right knee: Secondary | ICD-10-CM | POA: Diagnosis not present

## 2024-03-24 NOTE — Telephone Encounter (Signed)
Auth needed for right knee gel  

## 2024-03-24 NOTE — Progress Notes (Signed)
 Office Visit Note   Patient: Robin Shields           Date of Birth: Jul 25, 1961           MRN: [id number] Visit Date: 03/24/2024 Requested by: No referring provider defined for this encounter. PCP: Patient, No Pcp Per  Subjective: Chief Complaint  Patient presents with   Right Knee - Follow-up    HPI: Robin Shields is a 63 y.o. female who presents to the office reporting painAP lateral merchant radiographs of the right knee are reviewed.  No acute fracture.  No dislocation.  Alignment normal.  No significant degenerative changes in the medial lateral or patellofemoral compartments..  Decision at this time is for again surgical intervention.  Overall she is doing much better than last office visit.  She is able to stand.  She is able to do stairs now with no pain.  She has achieved full extension.  Still lacks a little bit of flexion.  She has not yet 100% but she is around 80 to 90%.  Injection in the knee last clinic visit helped her to get up and down the stairs.  She did actually swim today on the day of her clinic appointment and she did well with that..                ROS: All systems reviewed are negative as they relate to the chief complaint within the history of present illness.  Patient denies fevers or chills.  Assessment & Plan: Visit Diagnoses:  1. Effusion, right knee     Plan: Impression is relatively asymptomatic meniscal tear in the right knee.  No effusion today and she is in general a pretty fit person.  Plan at this time is to continue with leg extension leg press and stationary bike to continue strengthening the quad.  If she has much in the way of recurrent effusion and/or mechanical symptoms develop in that right knee I think repeat evaluation is indicated with possible injection and/or arthroscopic debridement at that time; however, for now she is doing well with nonoperative treatment.  It is not perfect but it is very functional for most of what she wants  to do.  She will follow-up with [place] as needed.  Gel injection would be another option for Robin Shields if she has recurrent symptoms. This patient is diagnosed with osteoarthritis of the knee(s).    Radiographs show evidence of joint space narrowing, osteophytes, subchondral sclerosis and/or subchondral cysts.  This patient has knee pain which interferes with functional and activities of daily living.    This patient has experienced inadequate response, adverse effects and/or intolerance with conservative treatments such as acetaminophen, NSAIDS, topical creams, physical therapy or regular exercise, knee bracing and/or weight loss.   This patient has experienced inadequate response or has a contraindication to intra articular steroid injections for at least 3 months.   This patient is not scheduled to have a total knee replacement within 6 months of starting treatment with viscosupplementation.   Follow-Up Instructions: No follow-ups on file.   Orders:  No orders of the defined types were placed in this encounter.  No orders of the defined types were placed in this encounter.     Procedures: No procedures performed   Clinical Data: No additional findings.  Objective: Vital Signs: LMP 08/09/2013   Physical Exam:  Constitutional: Patient appears well-developed HEENT:  Head: Normocephalic Eyes:EOM are normal Neck: Normal range of motion Cardiovascular: Normal rate Pulmonary/chest: Effort  normal Neurologic: Patient is alert Skin: Skin is warm Psychiatric: Patient has normal mood and affect  Ortho Exam: Ortho exam demonstrates normal gait alignment.  No effusion of the right knee.  Collateral crucial ligaments are stable.  Extensor mechanism intact.  No masses lymphadenopathy or skin changes noted in that right knee region with full range of motion.  Not much in the way of medial or lateral joint line tenderness.  Specialty Comments:  No specialty comments  available.  Imaging: No results found.   PMFS History: There are no active problems to display for this patient.  No past medical history on file.  Family History  Problem Relation Age of Onset   Breast cancer Sister 70       BRCA negative   Hypertension Mother    Breast cancer Maternal Grandmother     Past Surgical History:  Procedure Laterality Date   BREAST EXCISIONAL BIOPSY Left 11/04/1997   HYSTEROSCOPY WITH RESECTOSCOPE Left 10/01/2004   endo polyps- (CR)   HYSTEROSCOPY WITH RESECTOSCOPE  07/11/2008   Polyp, D&C- (CR)   TONSILLECTOMY AND ADENOIDECTOMY  1965   Social History   Occupational History   Not on file  Tobacco Use   Smoking status: Unknown   Smokeless tobacco: Not on file  Substance and Sexual Activity   Alcohol use: Not on file   Drug use: Not on file   Sexual activity: Not on file

## 2024-04-15 NOTE — Telephone Encounter (Signed)
 VOB submitted for Monovisc, right knee

## 2024-05-26 DIAGNOSIS — Z1151 Encounter for screening for human papillomavirus (HPV): Secondary | ICD-10-CM | POA: Diagnosis not present

## 2024-05-26 DIAGNOSIS — Z681 Body mass index (BMI) 19 or less, adult: Secondary | ICD-10-CM | POA: Diagnosis not present

## 2024-05-26 DIAGNOSIS — Z124 Encounter for screening for malignant neoplasm of cervix: Secondary | ICD-10-CM | POA: Diagnosis not present

## 2024-05-26 DIAGNOSIS — Z1231 Encounter for screening mammogram for malignant neoplasm of breast: Secondary | ICD-10-CM | POA: Diagnosis not present

## 2024-07-30 DIAGNOSIS — M816 Localized osteoporosis [Lequesne]: Secondary | ICD-10-CM | POA: Diagnosis not present

## 2024-07-30 DIAGNOSIS — N958 Other specified menopausal and perimenopausal disorders: Secondary | ICD-10-CM | POA: Diagnosis not present

## 2024-08-11 DIAGNOSIS — Z0000 Encounter for general adult medical examination without abnormal findings: Secondary | ICD-10-CM | POA: Diagnosis not present

## 2024-08-11 DIAGNOSIS — Z1331 Encounter for screening for depression: Secondary | ICD-10-CM | POA: Diagnosis not present

## 2024-08-11 DIAGNOSIS — Z681 Body mass index (BMI) 19 or less, adult: Secondary | ICD-10-CM | POA: Diagnosis not present

## 2024-08-11 DIAGNOSIS — Z131 Encounter for screening for diabetes mellitus: Secondary | ICD-10-CM | POA: Diagnosis not present

## 2024-08-11 DIAGNOSIS — Z13220 Encounter for screening for lipoid disorders: Secondary | ICD-10-CM | POA: Diagnosis not present

## 2024-08-11 DIAGNOSIS — S83206A Unspecified tear of unspecified meniscus, current injury, right knee, initial encounter: Secondary | ICD-10-CM | POA: Diagnosis not present

## 2024-08-11 DIAGNOSIS — Z136 Encounter for screening for cardiovascular disorders: Secondary | ICD-10-CM | POA: Diagnosis not present

## 2024-09-01 ENCOUNTER — Ambulatory Visit: Payer: Self-pay | Admitting: Family Medicine

## 2024-09-01 VITALS — BP 120/72 | Ht 65.5 in | Wt 121.0 lb

## 2024-09-01 DIAGNOSIS — M818 Other osteoporosis without current pathological fracture: Secondary | ICD-10-CM | POA: Diagnosis not present

## 2024-09-01 NOTE — Patient Instructions (Addendum)
 You have osteoporosis. Calcium 1200mg  daily, vitamin D  800 international units daily. For the knee avoid deep squats, deep lunges, leg press, twisting activities. Ask [facility] to fax us  the results of your CMP and Vitamin D  if they've been done in the past 3 months.  Otherwise either they or we can put in orders for these. Continue exercising. We discussed Prolia and bisphosphonates mainly.  Consider Prolia- if you want to go ahead with this let us  know and we would check with your insurance. Have your bone density rechecked in 2 years.

## 2024-09-02 LAB — VITAMIN D 25 HYDROXY (VIT D DEFICIENCY, FRACTURES): Vit D, 25-Hydroxy: 63.7 ng/mL (ref 30.0–100.0)

## 2024-09-03 ENCOUNTER — Encounter: Payer: Self-pay | Admitting: Family Medicine

## 2024-09-03 NOTE — Progress Notes (Signed)
 PCP: Frederik Charleston, MD  Subjective:   HPI: Patient is a 63 y.o. female here for osteoporosis.  Prior treatment: none History of Hip, Spine, or Wrist Fracture: no Heart disease or stroke: no Cancer: no Kidney Disease: no Gastric/Peptic Ulcer: no Gastric bypass surgery: no Severe GERD: no History of seizures: no Age at Menopause: 53 Calcium intake: 900mg  in food daily Vitamin D  intake: 1000 international units daily Hormone replacement therapy: no Smoking history: never Alcohol: none Exercise: swimming, line dancing, walking.  Weight training on hold due to injury Major dental work in past year: tooth extraction due to failed root canal Parents with hip/spine fracture: not sure - mother with neck fracture at age 56  History reviewed. No pertinent past medical history.  Current Outpatient Medications on File Prior to Visit  Medication Sig Dispense Refill   Cholecalciferol (VITAMIN D  PO) Take 1 tablet by mouth daily.     Flaxseed, Linseed, (FLAX SEED OIL PO) Take 1 tablet by mouth daily.      KRILL OIL PO Take 1 tablet by mouth daily.     MAGNESIUM PO Take 1 tablet by mouth daily.      Multiple Vitamin (MULTIVITAMIN WITH MINERALS) TABS tablet Take 1 tablet by mouth daily.     Probiotic Product (PROBIOTIC PO) Take 1 tablet by mouth daily.     No current facility-administered medications on file prior to visit.    Past Surgical History:  Procedure Laterality Date   BREAST EXCISIONAL BIOPSY Left 11/04/1997   HYSTEROSCOPY WITH RESECTOSCOPE Left 10/01/2004   endo polyps- (CR)   HYSTEROSCOPY WITH RESECTOSCOPE  07/11/2008   Polyp, D&C- (CR)   TONSILLECTOMY AND ADENOIDECTOMY  1965    Allergies  Allergen Reactions   Codeine Nausea And Vomiting   Morphine And Codeine Nausea And Vomiting    BP 120/72   Ht 5' 5.5 (1.664 m)   Wt 121 lb (54.9 kg)   LMP 08/09/2013   BMI 19.83 kg/m       No data to display              No data to display               Objective:  Physical Exam:  Gen: NAD, comfortable in exam room  Dexa T scores 07/30/24: L neck -2.9, R neck -2.7, Spine -2.4   Assessment & Plan:  1. Osteoporosis - worst T score -2.9 at left femoral neck.  No history of osteoporotic fracture.  Discussed prolia and bisphosphonates - without other risk factors unlikely anabolic medications would be covered.  Will have CMP and Vitamin D  labs sent or drawn.  Continue with exercise.  Encouraged calcium and vitamin D  supplementation (slightly more calcium than she's taking now).  She will let us  know if she would like to proceed with treatment.  Recheck dexa in 07/2026.  Total visit time 30 minutes including documentation.

## 2024-09-07 ENCOUNTER — Encounter: Payer: Self-pay | Admitting: *Deleted

## 2024-09-07 ENCOUNTER — Ambulatory Visit: Payer: Self-pay | Admitting: Family Medicine

## 2024-09-07 LAB — COMPREHENSIVE METABOLIC PANEL WITH GFR
ALT: 21 IU/L (ref 0–32)
AST: 25 IU/L (ref 0–40)
Albumin: 4.4 g/dL (ref 3.9–4.9)
Alkaline Phosphatase: 86 IU/L (ref 49–135)
BUN/Creatinine Ratio: 26 (ref 12–28)
BUN: 20 mg/dL (ref 8–27)
Bilirubin Total: 0.2 mg/dL (ref 0.0–1.2)
CO2: 22 mmol/L (ref 20–29)
Calcium: 9.6 mg/dL (ref 8.7–10.3)
Chloride: 102 mmol/L (ref 96–106)
Creatinine, Ser: 0.78 mg/dL (ref 0.57–1.00)
Globulin, Total: 2.4 g/dL (ref 1.5–4.5)
Glucose: 85 mg/dL (ref 70–99)
Potassium: 4.3 mmol/L (ref 3.5–5.2)
Sodium: 141 mmol/L (ref 134–144)
Total Protein: 6.8 g/dL (ref 6.0–8.5)
eGFR: 84 mL/min/1.73 (ref >59)

## 2024-09-07 LAB — SPECIMEN STATUS REPORT

## 2024-10-25 ENCOUNTER — Encounter: Payer: Self-pay | Admitting: Radiology

## 2024-11-25 ENCOUNTER — Encounter: Payer: Self-pay | Admitting: Family Medicine
